# Patient Record
Sex: Male | Born: 1940 | Race: Black or African American | Hispanic: No | Marital: Married | Smoking: Never smoker
Health system: Southern US, Community
[De-identification: ages and names within clinical notes are randomized; demographics above are authoritative.]

## PROBLEM LIST (undated history)

## (undated) DIAGNOSIS — R42 Dizziness and giddiness: Secondary | ICD-10-CM

## (undated) HISTORY — PX: TONSILLECTOMY AND ADENOIDECTOMY: SHX28

## (undated) HISTORY — DX: Dizziness and giddiness: R42

---

## 2009-01-13 DEATH — deceased

## 2019-06-24 ENCOUNTER — Ambulatory Visit (INDEPENDENT_AMBULATORY_CARE_PROVIDER_SITE_OTHER): Payer: Medicare Other | Admitting: Family Medicine

## 2019-06-24 ENCOUNTER — Other Ambulatory Visit: Payer: Self-pay

## 2019-06-24 ENCOUNTER — Encounter: Payer: Self-pay | Admitting: Family Medicine

## 2019-06-24 VITALS — BP 130/70 | HR 89 | Temp 99.1°F | Ht 71.5 in | Wt 152.5 lb

## 2019-06-24 DIAGNOSIS — R011 Cardiac murmur, unspecified: Secondary | ICD-10-CM

## 2019-06-24 DIAGNOSIS — R350 Frequency of micturition: Secondary | ICD-10-CM

## 2019-06-24 DIAGNOSIS — R42 Dizziness and giddiness: Secondary | ICD-10-CM | POA: Diagnosis not present

## 2019-06-24 DIAGNOSIS — R9431 Abnormal electrocardiogram [ECG] [EKG]: Secondary | ICD-10-CM | POA: Diagnosis not present

## 2019-06-24 DIAGNOSIS — D649 Anemia, unspecified: Secondary | ICD-10-CM

## 2019-06-24 DIAGNOSIS — N5089 Other specified disorders of the male genital organs: Secondary | ICD-10-CM

## 2019-06-24 DIAGNOSIS — J302 Other seasonal allergic rhinitis: Secondary | ICD-10-CM

## 2019-06-24 LAB — POCT URINALYSIS DIP (PROADVANTAGE DEVICE)
Bilirubin, UA: NEGATIVE
Blood, UA: NEGATIVE
Glucose, UA: NEGATIVE mg/dL
Ketones, POC UA: NEGATIVE mg/dL
Leukocytes, UA: NEGATIVE
Nitrite, UA: NEGATIVE
Protein Ur, POC: NEGATIVE mg/dL
Specific Gravity, Urine: 1.03
Urobilinogen, Ur: NEGATIVE
pH, UA: 6 (ref 5.0–8.0)

## 2019-06-24 NOTE — Patient Instructions (Signed)
You will receive a call from [facility] in [place].   I am ordering an ultrasound of your testicle.  For the dizzy spells, make sure you are changing positions slowly.  You may take the meclizine as needed.  Let me know if this is not helping.  We will be in touch with your lab results from today

## 2019-06-24 NOTE — Progress Notes (Signed)
Subjective:    Patient ID: Stephen Montes, male    DOB: September 19, 1940, 79 y.o.   MRN: [id number]  HPI Chief Complaint  Patient presents with  . new pt    new pt, get established. ER- for dizziness- inner ear    He is new to the practice and here to establish care.  Previous medical care: moved here from [place] July 2020   Other providers: None   States he was diagnosed with vertigo last week and the ED in [place].  States he was prescribed meclizine but only for 5 days.  States the medication was working.  States he only has dizziness which he describes as a spinning sensation, when he gets up first thing in the morning.  States it only lasts a few seconds and he does not have it throughout the day or at night while sleeping.  Denies any numbness, tingling or weakness.  Apparently during the exam last week in the ED, he was told for the first time that he had a heart murmur and he had an abnormal EKG.  It was recommended that he follow up with a cardiologist.   He was also diagnosed with mild anemia.  He denies any blood loss including in his stool.  States colonoscopy was in the past 3 to 4 years.  Denies chest pain, palpitations, DOE, abdominal pain, N/V/D.   He also reports that his left testicle has been different for the past 5-6 months.  States he feels a bulge or mass that he thinks is new.  Denies pain or tenderness.  States he has had some mild urinary frequency but no hesitancy or urgency.  States he is emptying his bladder fine.  Sinus issues worse with seasonal allergies.   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 Denies smoking, drinking alcohol, drug use Exercise: walks outside    Reviewed allergies, medications, past medical, surgical, family, and social history.    Review of Systems Pertinent positives and negatives in the history of present illness.     Objective:   Physical Exam Exam conducted  with a chaperone present.  Constitutional:      General: He is not in acute distress.    Appearance: He is normal weight. He is not ill-appearing.  Eyes:     General: No visual field deficit.    Extraocular Movements: Extraocular movements intact.     Conjunctiva/sclera: Conjunctivae normal.     Pupils: Pupils are equal, round, and reactive to light.  Cardiovascular:     Rate and Rhythm: Normal rate and regular rhythm.     Pulses: Normal pulses.     Heart sounds: Murmur present.  Pulmonary:     Effort: Pulmonary effort is normal.     Breath sounds: Normal breath sounds.  Abdominal:     General: Abdomen is flat.     Palpations: Abdomen is soft.  Genitourinary:    Penis: Uncircumcised.      Comments: Small, round, soft mass superior to left testicle and not in the testicle itself  Musculoskeletal:     Cervical back: Normal range of motion and neck supple.     Right lower leg: No edema.     Left lower leg: No edema.  Skin:    General: Skin is warm and dry.     Capillary Refill: Capillary refill takes less than 2 seconds.  Neurological:     General: No focal deficit  present.     Mental Status: He is alert.     Cranial Nerves: Cranial nerves are intact. No facial asymmetry.     Sensory: Sensation is intact.     Motor: Motor function is intact. No pronator drift.     Coordination: Coordination is intact.     Gait: Gait is intact.  Psychiatric:        Attention and Perception: Attention normal.        Mood and Affect: Mood normal.        Speech: Speech normal.        Behavior: Behavior normal.        Cognition and Memory: Cognition normal.    BP 130/70   Pulse 89   Temp 99.1 F (37.3 C)   Ht 5' 11.5" (1.816 m)   Wt 152 lb 8 oz (69.2 kg)   BMI 20.97 kg/m       Assessment & Plan:  Dizziness - Plan: CBC with Differential/Platelet, Comprehensive metabolic panel -I agree that the dizziness he describes sounds like vertigo and I will refill meclizine for him.  Encouraged  him to change positions slowly.  He is not orthostatic.  I could not elicit the dizzy symptom today.  Neurological exam He will follow-up if this does not resolve or if worsening.  Consider referral to PT for Epley maneuvers  Abnormal EKG - Plan: Ambulatory referral to Cardiology -EKG is in care everywhere, I did not repeat this today since there are no new symptoms.  It showed a first-degree AV block with Q waves and most likely repolarization  Heart murmur - Plan: CBC with Differential/Platelet, Comprehensive metabolic panel, Ambulatory referral to Cardiology -He is quite certain that he has never had a heart murmur before being told last week in the ED  Urinary frequency - Plan: POCT Urinalysis DIP (Proadvantage Device)- UA is negative.  No other urinary symptoms.  He will follow-up if worsening and I will follow-up pending lab results  Seasonal allergies -Over-the-counter allergy medication  Mass of scrotum - Plan: US SCROTUM W/DOPPLER, CANCELED: US Scrotum -Suspect this is a spermatocele but will confirm with ultrasound  Anemia, unspecified type - Plan: CBC with Differential/Platelet, Iron, TIBC and Ferritin Panel -Mild anemia.  Denies any bleeding.  Colonoscopy is up-to-date.  Check labs and follow-up

## 2019-06-25 ENCOUNTER — Other Ambulatory Visit: Payer: Self-pay | Admitting: Internal Medicine

## 2019-06-25 LAB — CBC WITH DIFFERENTIAL/PLATELET
Basophils Absolute: 0 10*3/uL (ref 0.0–0.2)
Basos: 1 %
EOS (ABSOLUTE): 0.2 10*3/uL (ref 0.0–0.4)
Eos: 4 %
Hematocrit: 38.2 % (ref 37.5–51.0)
Hemoglobin: 12.7 g/dL — ABNORMAL LOW (ref 13.0–17.7)
Immature Grans (Abs): 0 10*3/uL (ref 0.0–0.1)
Immature Granulocytes: 0 %
Lymphocytes Absolute: 2.1 10*3/uL (ref 0.7–3.1)
Lymphs: 46 %
MCH: 32.2 pg (ref 26.6–33.0)
MCHC: 33.2 g/dL (ref 31.5–35.7)
MCV: 97 fL (ref 79–97)
Monocytes Absolute: 0.4 10*3/uL (ref 0.1–0.9)
Monocytes: 8 %
Neutrophils Absolute: 1.9 10*3/uL (ref 1.4–7.0)
Neutrophils: 41 %
Platelets: 207 10*3/uL (ref 150–450)
RBC: 3.95 x10E6/uL — ABNORMAL LOW (ref 4.14–5.80)
RDW: 12.2 % (ref 11.6–15.4)
WBC: 4.6 10*3/uL (ref 3.4–10.8)

## 2019-06-25 LAB — COMPREHENSIVE METABOLIC PANEL
ALT: 9 IU/L (ref 0–44)
AST: 22 IU/L (ref 0–40)
Albumin/Globulin Ratio: 1.4 (ref 1.2–2.2)
Albumin: 4.4 g/dL (ref 3.7–4.7)
Alkaline Phosphatase: 98 IU/L (ref 39–117)
BUN/Creatinine Ratio: 14 (ref 10–24)
BUN: 16 mg/dL (ref 8–27)
Bilirubin Total: 0.5 mg/dL (ref 0.0–1.2)
CO2: 27 mmol/L (ref 20–29)
Calcium: 9.6 mg/dL (ref 8.6–10.2)
Chloride: 104 mmol/L (ref 96–106)
Creatinine, Ser: 1.13 mg/dL (ref 0.76–1.27)
GFR calc Af Amer: 72 mL/min/{1.73_m2} (ref >59)
GFR calc non Af Amer: 62 mL/min/{1.73_m2} (ref >59)
Globulin, Total: 3.1 g/dL (ref 1.5–4.5)
Glucose: 91 mg/dL (ref 65–99)
Potassium: 4.2 mmol/L (ref 3.5–5.2)
Sodium: 142 mmol/L (ref 134–144)
Total Protein: 7.5 g/dL (ref 6.0–8.5)

## 2019-06-25 LAB — IRON,TIBC AND FERRITIN PANEL
Ferritin: 210 ng/mL (ref 30–400)
Iron Saturation: 32 % (ref 15–55)
Iron: 79 ug/dL (ref 38–169)
Total Iron Binding Capacity: 248 ug/dL — ABNORMAL LOW (ref 250–450)
UIBC: 169 ug/dL (ref 111–343)

## 2019-06-25 MED ORDER — MECLIZINE HCL 12.5 MG PO TABS: 12.5000 mg | ORAL_TABLET | Freq: Three times a day (TID) | ORAL | 0 refills | Status: DC | PRN

## 2019-06-25 NOTE — Progress Notes (Signed)
Labs show mild anemia but normal iron. His other labs are fine.

## 2019-06-30 ENCOUNTER — Ambulatory Visit
Admission: RE | Admit: 2019-06-30 | Discharge: 2019-06-30 | Disposition: A | Discharge status: Home / Self Care | Payer: Medicare Other | Source: Ambulatory Visit | Attending: Family Medicine | Admitting: Family Medicine

## 2019-06-30 DIAGNOSIS — N5089 Other specified disorders of the male genital organs: Secondary | ICD-10-CM

## 2019-06-30 NOTE — Progress Notes (Signed)
Please let him know that his ultrasound shows that the area in his scrotum is a benign cyst and nothing to worry about unless he starts having issues such as pain or if they become bothersome to him.  We would then refer him to a urologist.  He can let me know

## 2019-07-05 ENCOUNTER — Encounter: Payer: Self-pay | Admitting: Internal Medicine

## 2019-07-12 ENCOUNTER — Ambulatory Visit: Payer: Medicare Other | Admitting: Cardiovascular Disease

## 2019-07-13 NOTE — Progress Notes (Signed)
CARDIOLOGY CONSULT NOTE       Patient ID: Stephen Montes MRN: [id number] DOB/AGE: 24-Mar-1941 79 y.o.  Admit date: (Not on file) Referring Physician: Suezanne Jacquet Stephen Primary Physician: Stephen Shackleton, Stephen-C Primary Cardiologist: New Reason for Consultation: Abnormal ECG  Active Problems:   * No active hospital problems. *   HPI:  79 y.o. referred by Stephen Montes for abnormal ECG Seen by her on 06/24/19. He has dizziness thought to be vertigo prescribed meclizine  Seen in [place] [facility] She noted murmur on exam as well ECG not in Epic her note indicates first degree AV block No previously documented cardiac issues. Denies history of murmur No history of RF, Scarlet Fever, diet pill use or SBE.   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 Two step daughters still in [place] . He has had COVID vaccine. No cardiac symptoms No chest pain , dyspnea or palpitations. His vertigo was precipitated by new hearing aids which he is no longer wearing. He said his primary in [place] never mentioned a murmur No history of RF, SBE or prolapse   ROS All other systems reviewed and negative except as noted above  Past Medical History:  Diagnosis Date  . Vertigo     Family History  Problem Relation Age of Onset  . CAD Mother     Social History   Socioeconomic History  . Marital status: Married    Spouse name: Not on file  . Number of children: Not on file  . Years of education: Not on file  . Highest education level: Not on file  Occupational History  . Not on file  Tobacco Use  . Smoking status: Never Smoker  . Smokeless tobacco: Never Used  Substance and Sexual Activity  . Alcohol use: Never  . Drug use: Never  . Sexual activity: Not on file  Other Topics Concern  . Not on file  Social History Narrative  . Not on file   Social Determinants of Health   Financial Resource Strain:   . Difficulty of Paying Living  Expenses:   Food Insecurity:   . Worried About Programme researcher, broadcasting/film/video in the Last Year:   . Barista in the Last Year:   Transportation Needs:   . Freight forwarder (Medical):   Marland Kitchen Lack of Transportation (Non-Medical):   Physical Activity:   . Days of Exercise per Week:   . Minutes of Exercise per Session:   Stress:   . Feeling of Stress :   Social Connections:   . Frequency of Communication with Friends and Family:   . Frequency of Social Gatherings with Friends and Family:   . Attends Religious Services:   . Active Member of Clubs or Organizations:   . Attends Banker Meetings:   Marland Kitchen Marital Status:   Intimate Partner Violence:   . Fear of Current or Ex-Partner:   . Emotionally Abused:   Marland Kitchen Physically Abused:   . Sexually Abused:     Past Surgical History:  Procedure Laterality Date  . TONSILLECTOMY AND ADENOIDECTOMY        Current Outpatient Medications:  .  meclizine (ANTIVERT) 12.5 MG tablet, Take 1 tablet (12.5 mg total) by mouth 3 (three) times daily as needed for dizziness., Disp: 30 tablet, Rfl: 0    Physical Exam: Blood pressure 114/60, pulse 84, temperature 99.1 F (  37.3 C), height 5' 11.5" (1.816 m), SpO2 97 %.    Affect appropriate Healthy:  appears stated age 79: normal Neck supple with no adenopathy JVP normal no bruits no thyromegaly Lungs clear with no wheezing and good diaphragmatic motion Heart:  S1/S2 fairly loud 4/6 MR  murmur, no rub, gallop or click PMI normal Abdomen: benighn, BS positve, no tenderness, no AAA no bruit.  No HSM or HJR Distal pulses intact with no bruits No edema Neuro non-focal Skin warm and dry No muscular weakness   Labs:   Lab Results  Component Value Date   WBC 4.6 06/24/2019   HGB 12.7 (L) 06/24/2019   HCT 38.2 06/24/2019   MCV 97 06/24/2019   PLT 207 06/24/2019   No results for input(s): NA, K, CL, CO2, BUN, CREATININE, CALCIUM, PROT, BILITOT, ALKPHOS, ALT, AST, GLUCOSE in the last  168 hours.  Invalid input(s): LABALBU No results found for: CKTOTAL, CKMB, CKMBINDEX, TROPONINI No results found for: CHOL No results found for: HDL No results found for: LDLCALC No results found for: TRIG No results found for: CHOLHDL No results found for: LDLDIRECT    Radiology: US SCROTUM W/DOPPLER  Result Date: 06/30/2019 CLINICAL DATA:  Left scrotal mass for 6 months. EXAM: SCROTAL ULTRASOUND DOPPLER ULTRASOUND OF THE TESTICLES TECHNIQUE: Complete ultrasound examination of the testicles, epididymis, and other scrotal structures was performed. Color and spectral Doppler ultrasound were also utilized to evaluate blood flow to the testicles. COMPARISON:  None. FINDINGS: Right testicle Measurements: 4.9 x 2.3 x 3.3 cm. No mass or microlithiasis visualized. Left testicle Measurements: 5.2 x 2.2 x 3.6 cm. No mass or microlithiasis visualized. Right epididymis:  Normal in size and appearance. Left epididymis: Prominent epididymal cysts most evident from the epididymal head, which may reflect a spermatocele. Overall epididymal size along with cysts is 4.1 x 1.6 x 3.9 cm. Hydrocele:  Small loculated inferior right hydrocele. Varicocele:  None visualized. Pulsed Doppler interrogation of both testes demonstrates normal low resistance arterial and venous waveforms bilaterally. IMPRESSION: 1. No acute findings. No evidence epididymitis/orchitis or testicular torsion. 2. No testicular masses. 3. The palpable abnormality corresponds to epididymal cysts versus septated spermatocele, overall enlarging the left epididymis to 4.1 x 1.6 x 3.9 cm Electronically Signed   By: Lajean Manes M.D.   On: 06/30/2019 16:36    EKG: SR rate 89 LAE inferior T wave changes Septal Q waves  PR 202 msec    ASSESSMENT AND PLAN:   1. 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 Murmur:  Given body habitus may have MVP   F/u echo ordered suspect he will have at least moderate MR Additional testing will depend  on severity and state of his LV He is functional class one with no symptoms however  3. Vertigo:  Continue PRN Meclizine if worsens refer to vestibular PT per primary no longer wearing hearing aids   Signed: Jenkins Rouge 07/16/2019, 1:01 PM

## 2019-07-14 ENCOUNTER — Telehealth: Payer: Self-pay

## 2019-07-14 MED ORDER — MECLIZINE HCL 12.5 MG PO TABS: 12.5000 mg | ORAL_TABLET | Freq: Three times a day (TID) | ORAL | 0 refills | Status: DC | PRN

## 2019-07-14 NOTE — Telephone Encounter (Signed)
Ok to refill 

## 2019-07-14 NOTE — Telephone Encounter (Signed)
Pt. Called LM stating he needed a refill on his meclizine, last apt was 06/24/19.

## 2019-07-14 NOTE — Addendum Note (Signed)
Addended by: Herminio Commons A on: 07/14/2019 04:18 PM   Modules accepted: Orders

## 2019-07-14 NOTE — Telephone Encounter (Signed)
done

## 2019-07-16 ENCOUNTER — Ambulatory Visit (INDEPENDENT_AMBULATORY_CARE_PROVIDER_SITE_OTHER): Payer: Medicare Other | Admitting: Cardiovascular Disease

## 2019-07-16 ENCOUNTER — Other Ambulatory Visit: Payer: Self-pay

## 2019-07-16 ENCOUNTER — Encounter: Payer: Self-pay | Admitting: Cardiovascular Disease

## 2019-07-16 VITALS — BP 114/60 | HR 84 | Temp 99.1°F | Ht 71.5 in | Wt 151.0 lb

## 2019-07-16 DIAGNOSIS — R011 Cardiac murmur, unspecified: Secondary | ICD-10-CM | POA: Diagnosis not present

## 2019-07-16 DIAGNOSIS — I340 Nonrheumatic mitral (valve) insufficiency: Secondary | ICD-10-CM | POA: Diagnosis not present

## 2019-07-16 DIAGNOSIS — R079 Chest pain, unspecified: Secondary | ICD-10-CM | POA: Diagnosis not present

## 2019-07-16 NOTE — Patient Instructions (Signed)
Medication Instructions:  Your physician recommends that you continue on your current medications as directed. Please refer to the Current Medication list given to you today.   Labwork: none  Testing/Procedures: Your physician has requested that you have an echocardiogram. Echocardiography is a painless test that uses sound waves to create images of your heart. It provides your doctor with information about the size and shape of your heart and how well your heart's chambers and valves are working. This procedure takes approximately one hour. There are no restrictions for this procedure.    Follow-Up: Your physician recommends that you schedule a follow-up appointment in: to be determined based on test    Any Other Special Instructions Will Be Listed Below (If Applicable).     If you need a refill on your cardiac medications before your next appointment, please call your pharmacy.   

## 2019-07-23 ENCOUNTER — Other Ambulatory Visit: Payer: Self-pay

## 2019-07-23 ENCOUNTER — Ambulatory Visit (HOSPITAL_COMMUNITY)
Admission: RE | Admit: 2019-07-23 | Discharge: 2019-07-23 | Disposition: A | Discharge status: Home / Self Care | Payer: Medicare Other | Source: Ambulatory Visit | Attending: Cardiovascular Disease | Admitting: Cardiovascular Disease

## 2019-07-23 DIAGNOSIS — R011 Cardiac murmur, unspecified: Secondary | ICD-10-CM | POA: Diagnosis present

## 2019-07-23 DIAGNOSIS — I340 Nonrheumatic mitral (valve) insufficiency: Secondary | ICD-10-CM | POA: Insufficient documentation

## 2019-07-23 NOTE — Progress Notes (Signed)
*  PRELIMINARY RESULTS* Echocardiogram 2D Echocardiogram has been performed.  Stacey Drain 07/23/2019, 3:03 PM

## 2019-09-16 ENCOUNTER — Telehealth: Payer: Self-pay | Admitting: Family Medicine

## 2019-09-16 NOTE — Telephone Encounter (Signed)
Requested records finally received from Dr. Earlean Shawl

## 2019-10-27 ENCOUNTER — Other Ambulatory Visit: Payer: Self-pay | Admitting: Family Medicine

## 2019-10-27 NOTE — Telephone Encounter (Signed)
Pt is still having dizziness with positions a couple times a week. Per vickie ok to refill but send to PT for elpey manever

## 2021-09-17 IMAGING — US US SCROTUM W/ DOPPLER COMPLETE
1 series · 13 of 25 positions shown · non-contrast
Comparison: None.

CLINICAL DATA: Left scrotal mass for 6 months.

EXAM:
SCROTAL ULTRASOUND
DOPPLER ULTRASOUND OF THE TESTICLES
TECHNIQUE: Complete ultrasound examination of the testicles, epididymis, and
other scrotal structures was performed. Color and spectral Doppler
ultrasound were also utilized to evaluate blood flow to the
testicles.

[Series 1: us scrotum w/ doppler complete · 0.06mm/px · 13 of 99 slices shown]
[im 1/99]
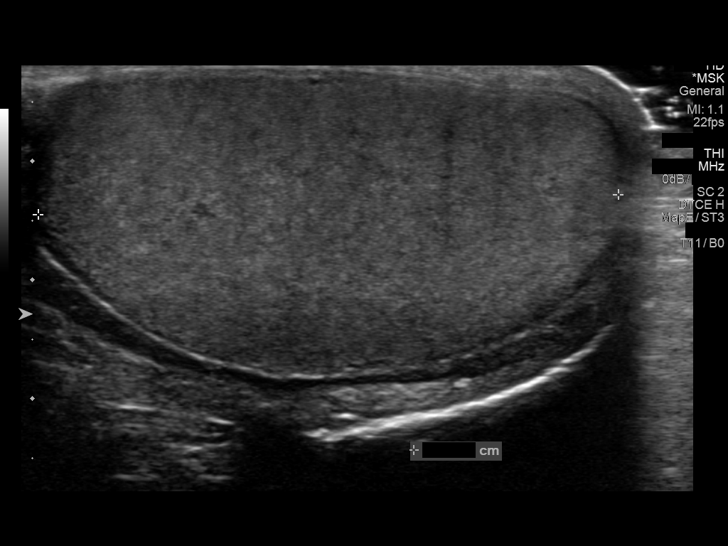
[im 9/99]
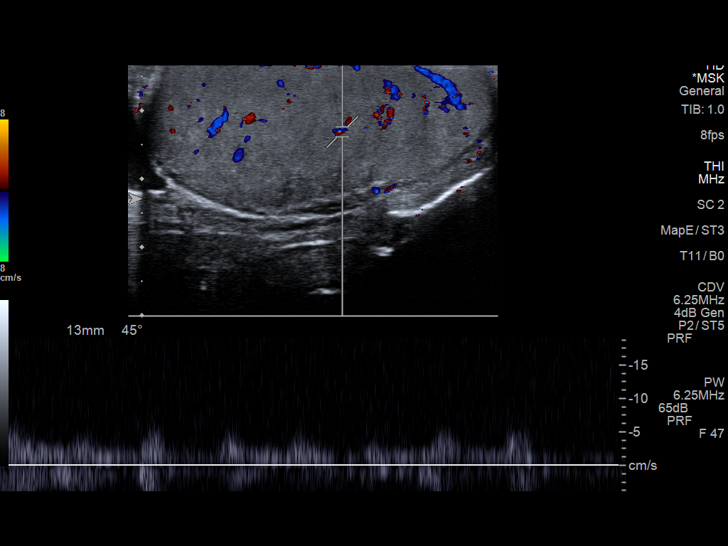
[im 17/99]
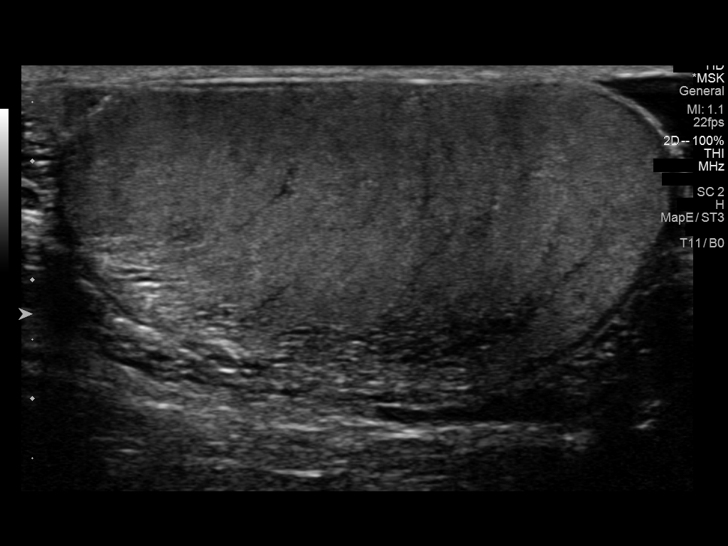
[im 25/99]
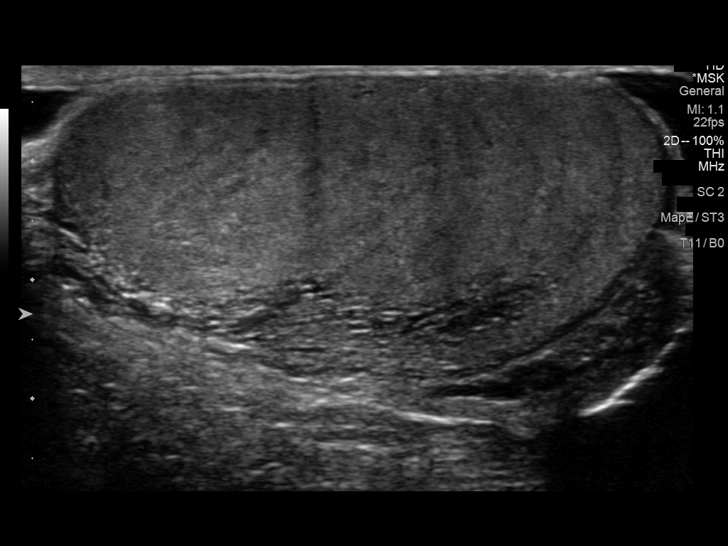
[im 33/99]
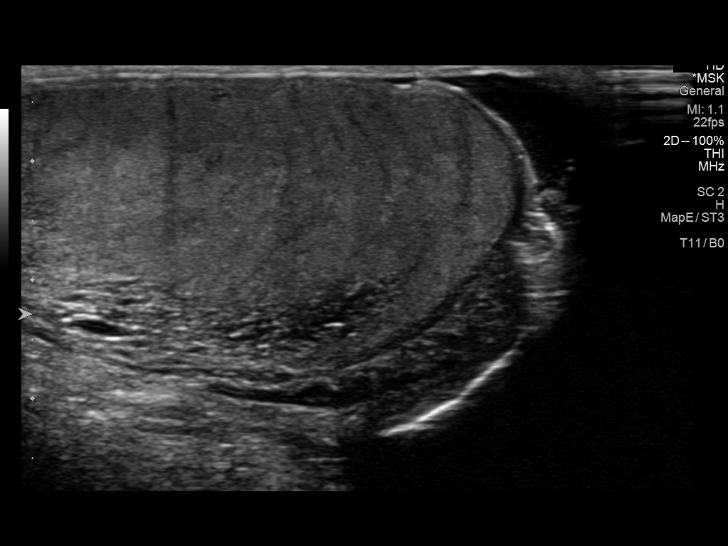
[im 41/99]
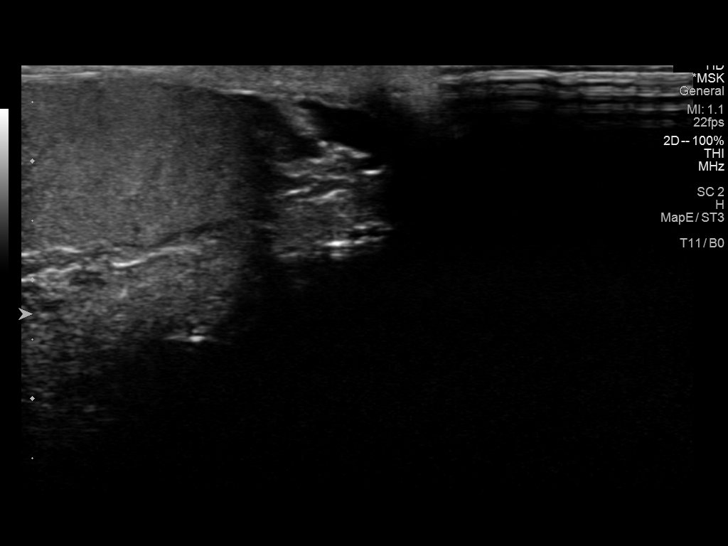
[im 50/99]
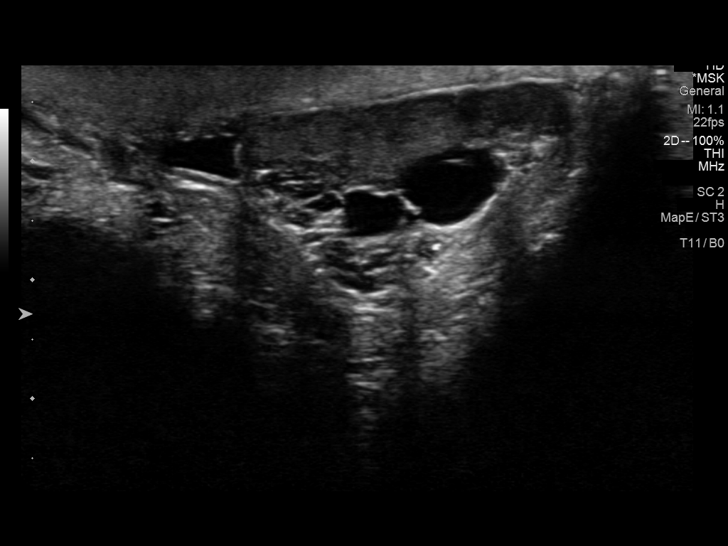
[im 58/99]
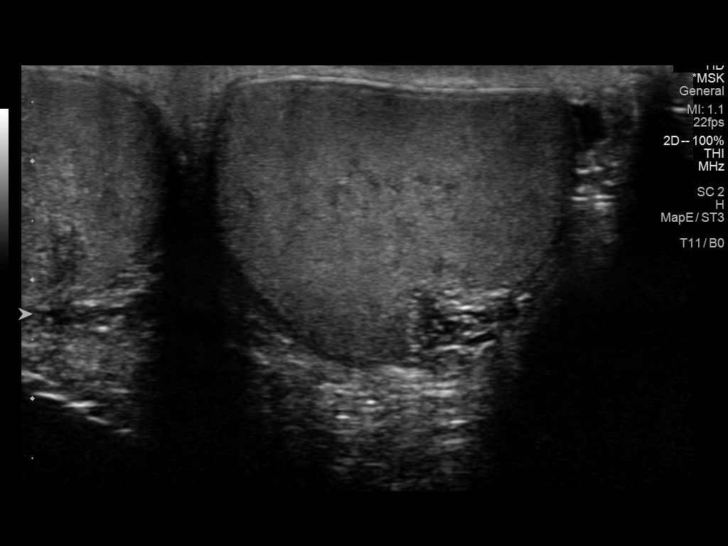
[im 66/99]
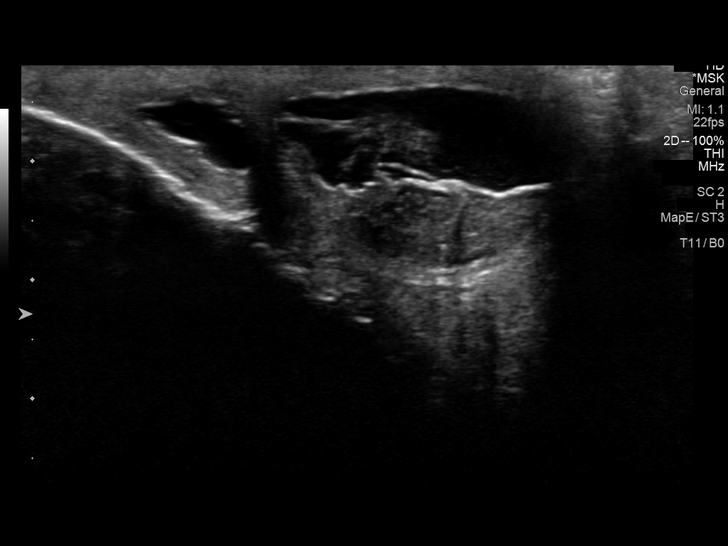
[im 74/99]
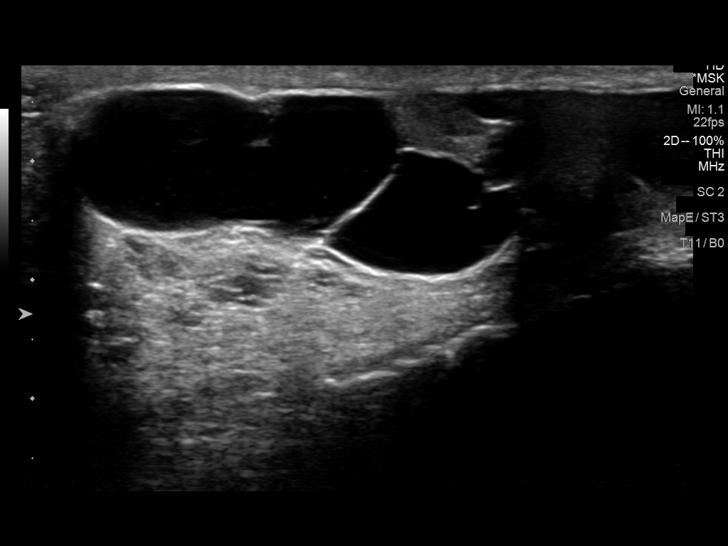
[im 82/99]
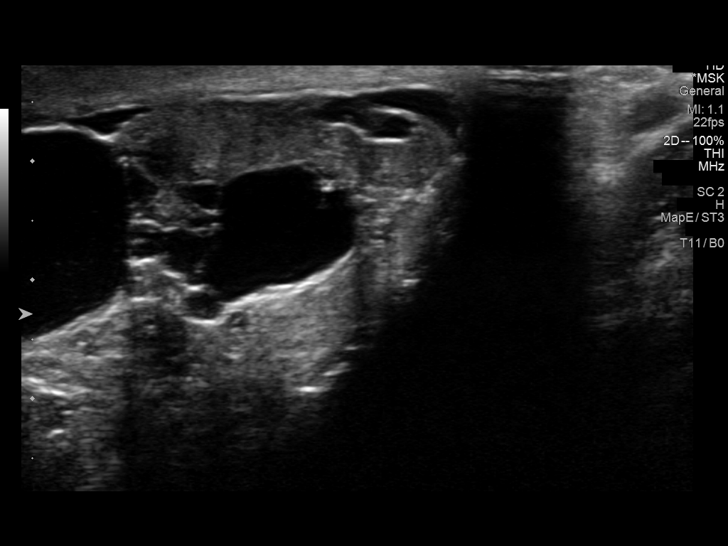
[im 90/99]
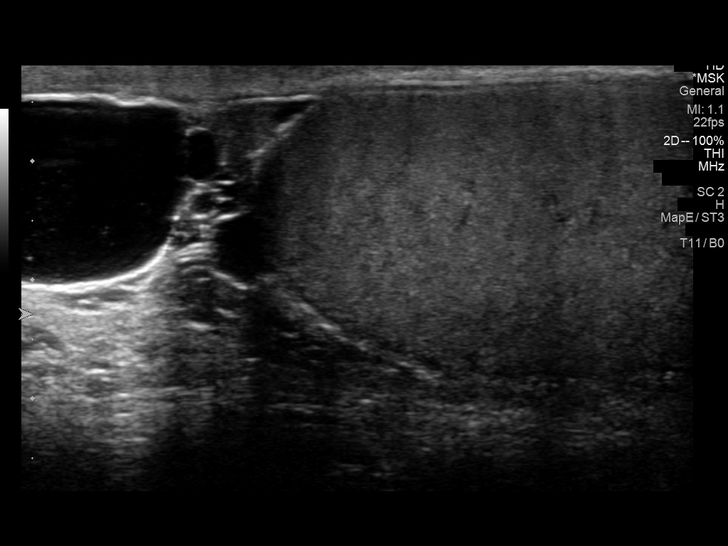
[im 99/99]
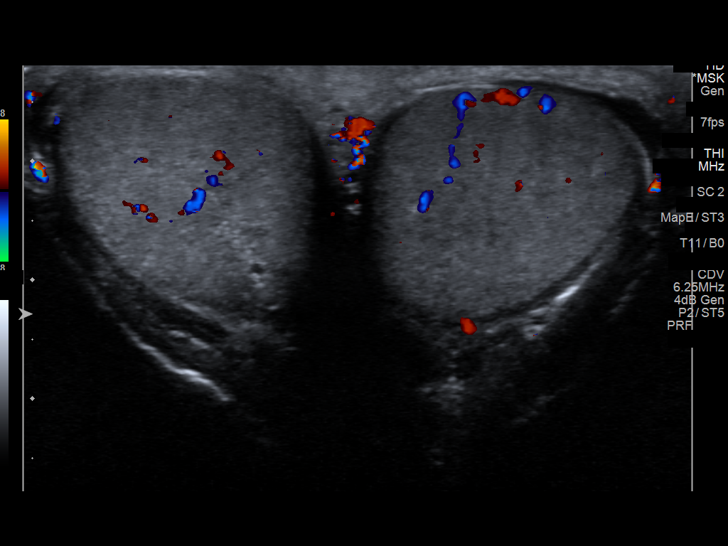

[13 of 25 positions shown; findings below may reference images not displayed]

FINDINGS: Right testicle

Measurements: 4.9 x 2.3 x 3.3 cm. No mass or microlithiasis
visualized.

Left testicle

Measurements: 5.2 x 2.2 x 3.6 cm. No mass or microlithiasis
visualized.

Right epididymis:  Normal in size and appearance.

Left epididymis: Prominent epididymal cysts most evident from the
epididymal head, which may reflect a spermatocele. Overall
epididymal size along with cysts is 4.1 x 1.6 x 3.9 cm.

Hydrocele:  Small loculated inferior right hydrocele.

Varicocele:  None visualized.

Pulsed Doppler interrogation of both testes demonstrates normal low
resistance arterial and venous waveforms bilaterally.
IMPRESSION: 1. No acute findings. No evidence epididymitis/orchitis or
testicular torsion.
2. No testicular masses.
3. The palpable abnormality corresponds to epididymal cysts versus
septated spermatocele, overall enlarging the left epididymis to
x 1.6 x 3.9 cm
